# Patient Record
Sex: Male | Born: 1985 | Race: White | Hispanic: No | Marital: Single
Health system: Southern US, Community
[De-identification: ages and names within clinical notes are randomized; demographics above are authoritative.]

## PROBLEM LIST (undated history)

## (undated) DIAGNOSIS — F411 Generalized anxiety disorder: Secondary | ICD-10-CM

## (undated) DIAGNOSIS — F330 Major depressive disorder, recurrent, mild: Secondary | ICD-10-CM

## (undated) HISTORY — PX: VASECTOMY: SHX75

## (undated) HISTORY — DX: Generalized anxiety disorder: F41.1

## (undated) HISTORY — DX: Major depressive disorder, recurrent, mild: F33.0

---

## 2007-06-16 ENCOUNTER — Ambulatory Visit: Payer: Self-pay | Admitting: Specialist

## 2008-07-13 IMAGING — CT CT OF THE RIGHT SHOULDER WITHOUT CONTRAST
1 series · 12 of 14 positions shown, 15 images · non-contrast
Comparison: none

REASON FOR EXAM: right clavicle eval the non union fracture right clavicle
COMMENTS:

[Series 2: shoulder 3mm · axial · 0.46mm/px · z∈[-508,-406]mm · 12 of 42 slices shown, 15 images]
[im 4/42  soft-tissue]
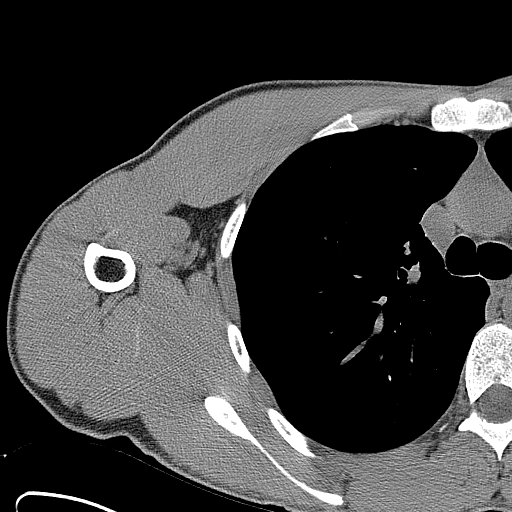
[im 4/42  bone]
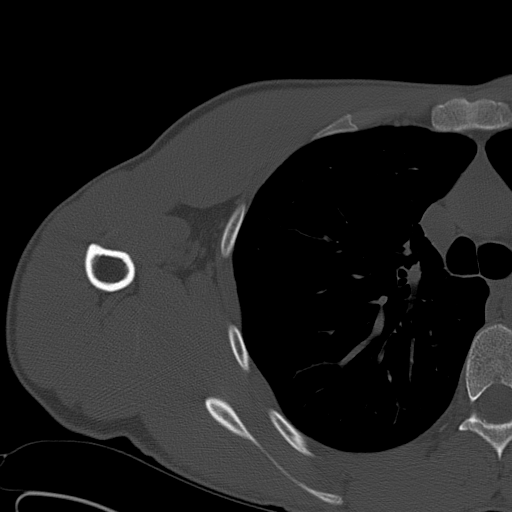
[im 7/42  bone]
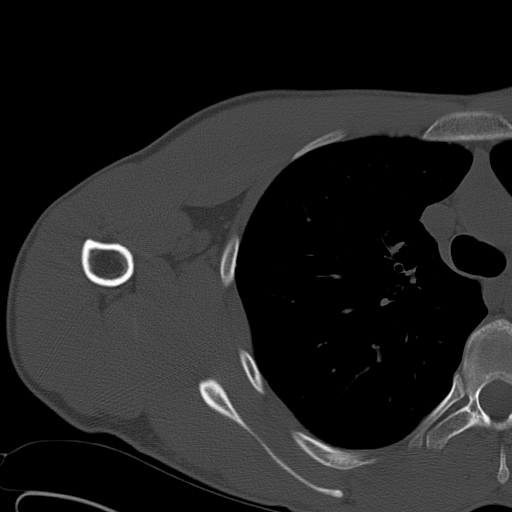
[im 10/42  bone]
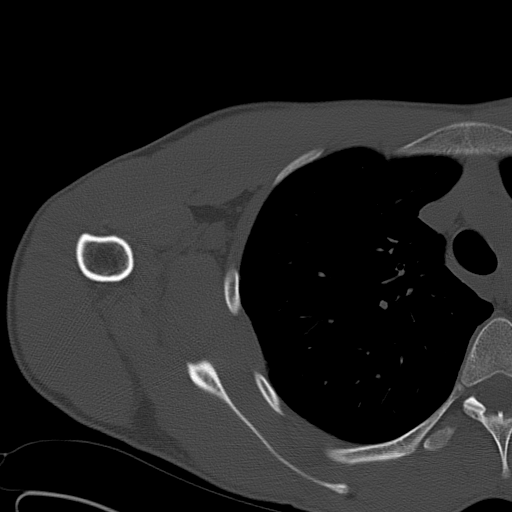
[im 13/42  bone]
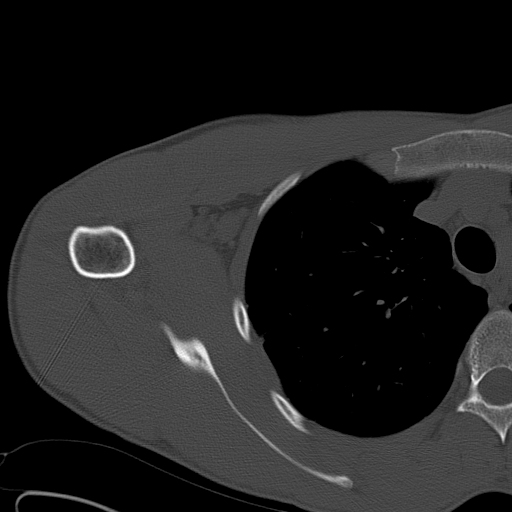
[im 16/42  soft-tissue]
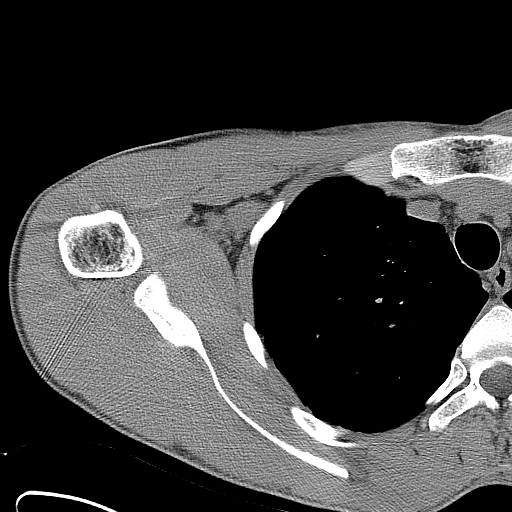
[im 16/42  bone]
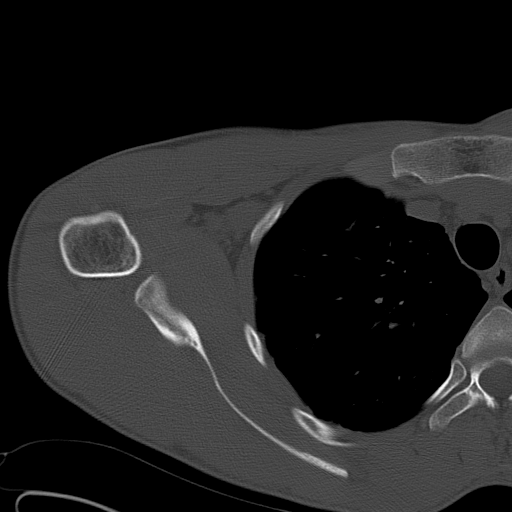
[im 19/42  bone]
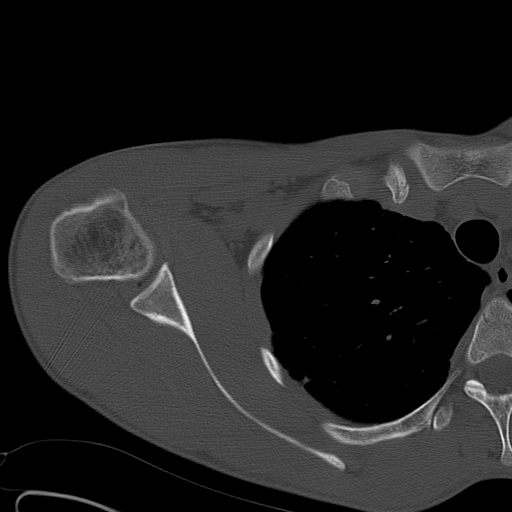
[im 23/42  bone]
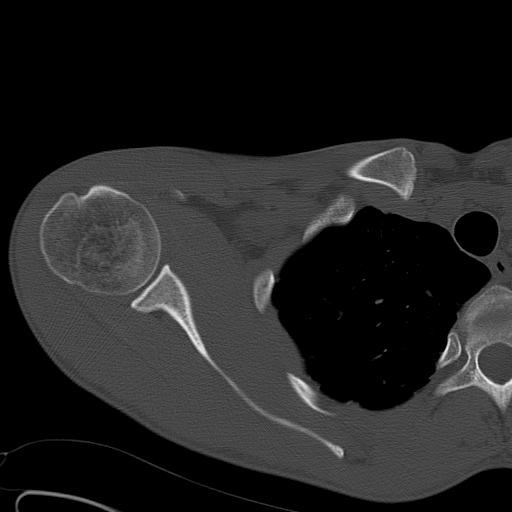
[im 26/42  bone]
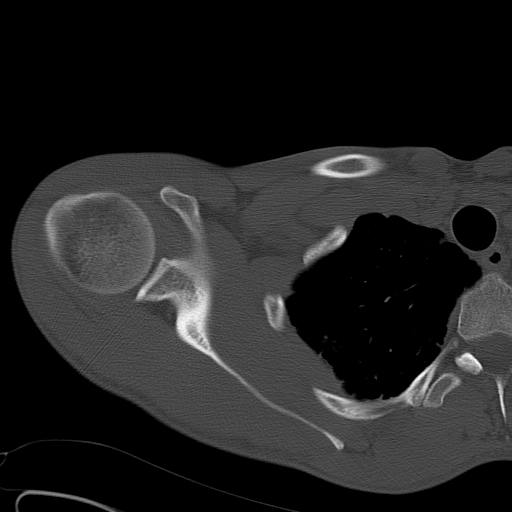
[im 29/42  soft-tissue]
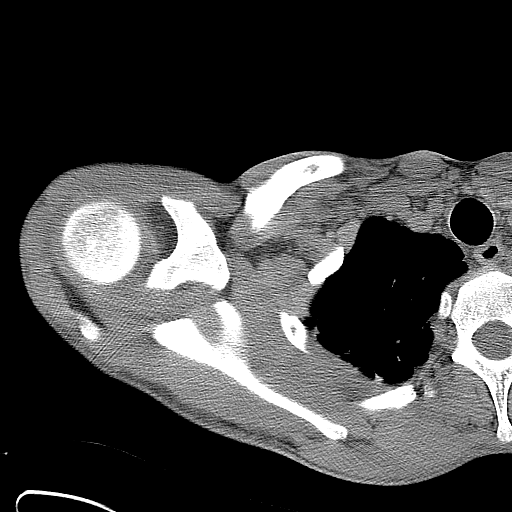
[im 29/42  bone]
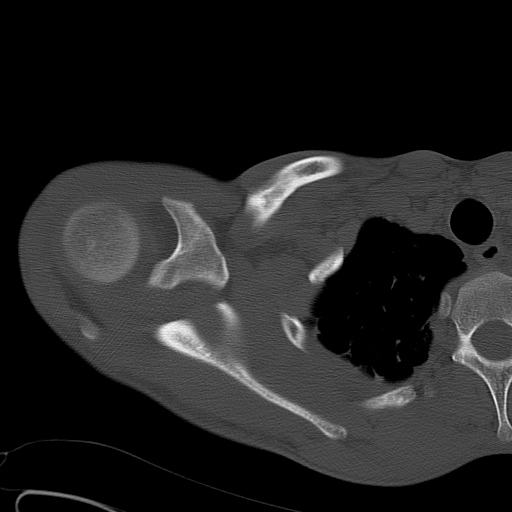
[im 32/42  bone]
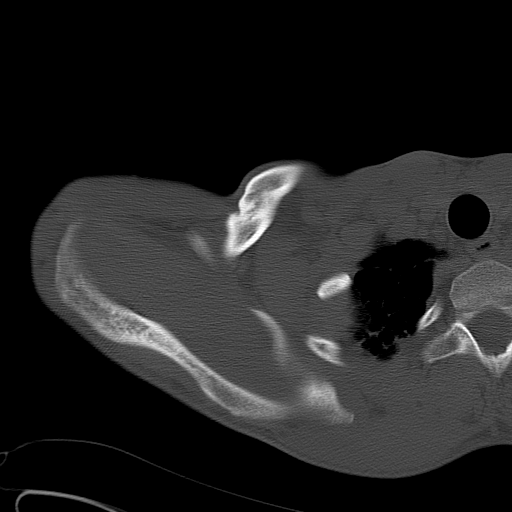
[im 35/42  bone]
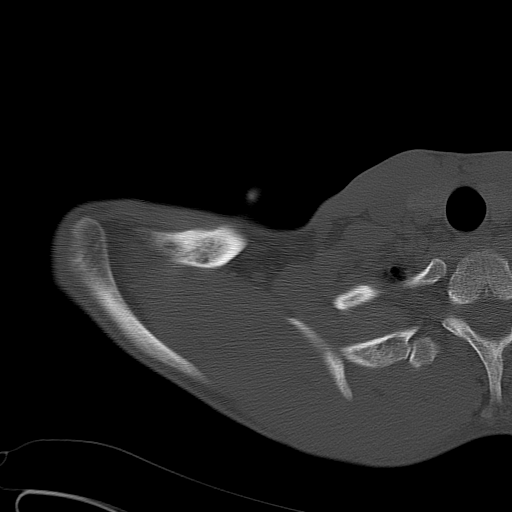
[im 38/42  bone]
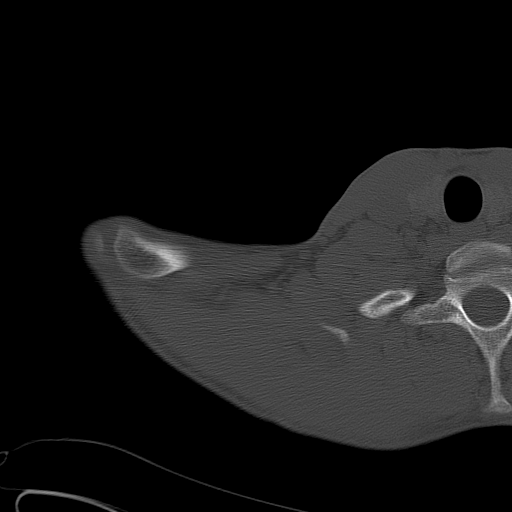

[12 of 14 positions shown; findings below may reference images not displayed]

PROCEDURE:     CT  - CT SHOULDER RIGHT WO  - June 16, 2007  [DATE]

RESULT:     Patient undergoing evaluation for apparent nonunion of a right
clavicular fracture. The patient has sustained a prior fracture of the
midshaft of the clavicle. Some lucency can be seen obliquely traversing the
midshaft of the clavicle which may reflect the residual fracture line. I see
no periosteal reaction. Is there obvious movement clinically at the fracture
site? The AC joint region is normal in appearance and the sternoclavicular
joint also appears normal. The glenohumeral joint is normal in appearance.
IMPRESSION: There is deformity of the midshaft of left clavicle and
there may be a residual fracture line, but the there is no periosteal
reaction and no surrounding soft tissue edema of significance. If there are
are clinical findings which suggest nonunion further imaging with MRI may be
of value in that it would allow evaluation of the bone marrow within the
clavicle for edema. Alternatively nuclear bone scanning could be considered
but it would like to be somewhat nonspecific.

## 2020-03-04 ENCOUNTER — Ambulatory Visit: Payer: 59 | Attending: Internal Medicine

## 2020-03-04 DIAGNOSIS — Z23 Encounter for immunization: Secondary | ICD-10-CM

## 2020-03-04 NOTE — Progress Notes (Signed)
   Covid-19 Vaccination Clinic  Name:  Jose Ruiz    MRN: [id number] DOB: Jan 27, 1986  03/04/2020  [name] was observed post Covid-19 immunization for 15 minutes without incident. He was provided with Vaccine Information Sheet and instruction to access the V-Safe system.   [name] was instructed to call 911 with any severe reactions post vaccine: Marland Kitchen Difficulty breathing  . Swelling of face and throat  . A fast heartbeat  . A bad rash all over body  . Dizziness and weakness   Immunizations Administered    Name Date Dose VIS Date Route   Pfizer COVID-19 Vaccine 03/04/2020  8:53 AM 0.3 mL 12/09/2019 Intramuscular   Manufacturer: [employer], [employer]   Lot: [id number]   NDC: [address]-4

## 2020-03-27 ENCOUNTER — Ambulatory Visit: Payer: 59 | Attending: Internal Medicine

## 2020-03-27 DIAGNOSIS — Z23 Encounter for immunization: Secondary | ICD-10-CM

## 2020-03-27 NOTE — Progress Notes (Signed)
   Covid-19 Vaccination Clinic  Name:  Jose Ruiz    MRN: [id number] DOB: Dec 31, 1985  03/27/2020  Jose Ruiz was observed post Covid-19 immunization for 15 minutes without incident. He was provided with Vaccine Information Sheet and instruction to access the V-Safe system.   Jose Ruiz was instructed to call 911 with any severe reactions post vaccine: Marland Kitchen Difficulty breathing  . Swelling of face and throat  . A fast heartbeat  . A bad rash all over body  . Dizziness and weakness   Immunizations Administered    Name Date Dose VIS Date Route   Pfizer COVID-19 Vaccine 03/27/2020  8:38 AM 0.3 mL 12/09/2019 Intramuscular   Manufacturer: [employer], [employer]   Lot: [id number]   NDC: [address]-7

## 2021-01-17 NOTE — Progress Notes (Signed)
 Assessment   Jose Ruiz is a 35 y.o. male presenting to [facility] for COVID testing.   Problem List Items Addressed This Visit    None    Visit Diagnoses    Contact with and (suspected) exposure to covid-19    -  Primary   Relevant Orders   RAPID INFLUENZA/RSV/COVID PCR   Contact with and (suspected) exposure to other viral communicable diseases       Relevant Orders   RAPID INFLUENZA/RSV/COVID PCR      Plan   If no testing performed, pt counseled on routine care for respiratory illness.  If testing performed, COVID sent.  Patient directed to Home given findings during today's visit.  Subjective   Jose Ruiz is a 35 y.o. male who [facility] with complaints of the following:  Exposure History: In the last 21 days?   Have you traveled outside of [place] ? No             Have you been in close contact with someone confirmed by a test to have COVID? (Close contact is within 6 feet for at least 10 minutes) Yes, Who? 0    Have you worked in a health care facility? No   Lived or worked facility like a nursing home, group home, or assisted living?   No     Are you scheduled to have surgery or a procedure in the next 3 days? No             Are you scheduled to receive cancer chemotherapy within the next 7 days?    No   Have you ever been tested before for COVID-19 with a swab of your nose? Yes: When: 2021, Where: 0  Are you a healthcare worker being tested so to return to work No     Right now,  do you have any of the following that developed over the past 7 days (as stated by patient on intake form):  Subjective fever (felt feverish) No  Chills (especially repeated shaking chills) No  Severe fatigue (felt very tired) Yes, how many days? 1  Muscle aches Yes, how many days? 1  Runny nose Yes, how many days? 1  Sore throat Yes, how many days? 1  Loss of taste or smell No  Cough (new onset or worsening  of chronic cough) Yes, how many days? 1  Shortness of breath No  Nausea or vomiting No  Headache Yes, how many days? 1  Abdominal Pain No  Diarrhea (3 or more loose stools in last 24 hours) No   History/Medical Conditions (as stated by patient on intake form):  Do you have any of the following:  Asthma or emphysema or COPD No  Cystic Fibrosis No  Diabetes No  High Blood Pressure  No  Cardiovascular Disease No  Chronic Kidney Disease No  Chronic Liver Disease No  Chronic blood disorder like Sickle Cell Disease  No  Weak immune system due to disease or medication No  Neurologic condition that limits movement  No  Developmental delay - Moderate to Severe  No  Recent (within past 2 weeks) or current Pregnancy No  Morbid Obesity (>100 pounds over ideal weight) No  Current Smoker No  Former Smoker No    Objective   Given above, testing performed: Yes  Testing Performed: Test Specimen Type Sent to  COVID-19  NP Sw[facility] [employer]    Scribe's Attestation: [place], [place] obtained  and performed the history, physical exam and medical decision making elements that were entered into the chart.  Signed by Holli Edin serving as Scribe, on 01/17/2021 12:52 PM  The documentation recorded by the scribe accurately reflects the service I personally performed and the decisions made by me. Antonio F. Trudy RIGGERS January 17, 2021 12:57 PM

## 2024-09-29 ENCOUNTER — Ambulatory Visit (INDEPENDENT_AMBULATORY_CARE_PROVIDER_SITE_OTHER): Payer: Self-pay | Admitting: Urology

## 2024-09-29 VITALS — BP 128/75 | HR 67 | Ht 77.0 in | Wt 184.4 lb

## 2024-09-29 DIAGNOSIS — Z8042 Family history of malignant neoplasm of prostate: Secondary | ICD-10-CM

## 2024-09-29 DIAGNOSIS — Z3009 Encounter for other general counseling and advice on contraception: Secondary | ICD-10-CM

## 2024-09-29 MED ORDER — DIAZEPAM 10 MG PO TABS: 10.0000 mg | ORAL_TABLET | Freq: Once | ORAL | 0 refills | Status: DC | PRN

## 2024-09-29 NOTE — Progress Notes (Signed)
   09/29/24 2:30 PM   Lynwood Toribio Laurence 1986/03/11 [id number]  CC: Discuss vasectomy, family history of prostate cancer  HPI: 38 year old male interested in vasectomy for permanent sterilization.  He and his wife have 4 children ages 61 through 72, do not desire further biologic children.  He has a family history of prostate cancer in his dad that required treatment and he was diagnosed at age 9.   Physical Exam: BP 128/75 (BP Location: Left Arm, Patient Position: Sitting, Cuff Size: Normal)   Pulse 67   Ht 6' 5 (1.956 m)   Wt 184 lb 6.4 oz (83.6 kg)   SpO2 97%   BMI 21.87 kg/m    Constitutional:  Alert and oriented, No acute distress. Cardiovascular: No clubbing, cyanosis, or edema. Respiratory: Normal respiratory effort, no increased work of breathing. GI: Abdomen is soft, nontender, nondistended, no abdominal masses GU: Phallus with pink meatus, no lesions, vas deferens easily palpable bilaterally, no testicular masses  Assessment & Plan:   38 year old male interested in vasectomy for permanent sterilization, family history of prostate cancer in his father diagnosed at age 57.  We reviewed the AUA guidelines that recommend initiating PSA screening at age 62 for men with a family history of prostate cancer.  We discussed the risks and benefits of vasectomy at length.  Vasectomy is intended to be a permanent form of contraception, and does not produce immediate sterility.  Following vasectomy another form of contraception is required until vas occlusion is confirmed by a post-vasectomy semen analysis obtained 2-3 months after the procedure.  Even after vas occlusion is confirmed, vasectomy is not 100% reliable in preventing pregnancy, and the failure rate is approximately 12/1998.  Repeat vasectomy is required in less than 1% of patients.  He should refrain from ejaculation for 1 week after vasectomy.  Options for fertility after vasectomy include vasectomy reversal, and sperm  retrieval with in vitro fertilization or ICSI.  These options are not always successful and may be expensive.  Finally, there are other permanent and non-permanent alternatives to vasectomy available. There is no risk of erectile dysfunction, and the volume of semen will be similar to prior, as the majority of the ejaculate is from the prostate and seminal vesicles.   The procedure takes ~20 minutes.  We recommend patients take 5-10 mg of Valium 30 minutes prior, and he will need a driver post-procedure.  Local anesthetic is injected into the scrotal skin and a small segment of the vas deferens is removed, and the ends occluded. The complication rate is approximately 1-2%, and includes bleeding, infection, and development of chronic scrotal pain.  PLAN: Schedule vasectomy Valium sent to pharmacy Start PSA screening at age 7  Redell Burnet, MD 09/29/2024  [facility] [address], [address] [place], [place] [address] [phone]

## 2024-09-29 NOTE — Patient Instructions (Signed)

## 2024-11-17 ENCOUNTER — Ambulatory Visit (INDEPENDENT_AMBULATORY_CARE_PROVIDER_SITE_OTHER): Admitting: Urology

## 2024-11-17 VITALS — BP 115/68 | HR 63 | Wt 185.0 lb

## 2024-11-17 DIAGNOSIS — Z9852 Vasectomy status: Secondary | ICD-10-CM

## 2024-11-17 NOTE — Progress Notes (Signed)
 VASECTOMY PROCEDURE NOTE:  The patient was taken to the minor procedure room and placed in the supine position. His genitals were prepped and draped in the usual sterile fashion. The right vas deferens was brought up to the skin of the right upper scrotum. The skin overlying it was anesthetized with 1% lidocaine without epinephrine, anesthetic was also injected alongside the vas deferens in the direction of the inguinal canal. The no scalpel vasectomy instrument was used to make a small perforation in the scrotal skin. The vasectomy clamp was used to grasp the vas deferens. It was carefully dissected free from surrounding structures. A 1cm segment of the vas was removed, and the cut ends of the mucosa were cauterized. No significant bleeding was noted. The vas deferens was returned to the scrotum. The skin incision was closed with a simple interrupted stitch of 4-0 chromic.  Attention was then turned to the left side. The left vasectomy was performed in the same exact fashion. Sterile dressings were placed over each incision. The patient tolerated the procedure well.  IMPRESSION/DIAGNOSIS: The patient is a 38 year old gentleman who underwent a vasectomy today. Post-procedure instructions were reviewed. I stressed the importance of continuing to use birth control until he provides a semen specimen more than 2 months from now that demonstrates azoospermia.  We discussed return precautions including fever over 101, significant bleeding or hematoma, or uncontrolled pain. I also stressed the importance of avoiding strenuous activity for one week, no sexual activity or ejaculations for 5 days, intermittent icing over the next 48 hours, and scrotal support.   PLAN: The patient will be advised of his semen analysis results when available.  Redell Burnet, MD 11/17/2024

## 2024-11-17 NOTE — Patient Instructions (Signed)

## 2025-01-05 ENCOUNTER — Encounter: Payer: Self-pay | Admitting: Family Medicine

## 2025-01-05 ENCOUNTER — Ambulatory Visit (INDEPENDENT_AMBULATORY_CARE_PROVIDER_SITE_OTHER): Admitting: Family Medicine

## 2025-01-05 VITALS — BP 119/74 | HR 82 | Temp 98.0°F | Ht 77.0 in | Wt 199.2 lb

## 2025-01-05 DIAGNOSIS — Z0000 Encounter for general adult medical examination without abnormal findings: Secondary | ICD-10-CM | POA: Diagnosis not present

## 2025-01-05 DIAGNOSIS — Z7689 Persons encountering health services in other specified circumstances: Secondary | ICD-10-CM

## 2025-01-05 DIAGNOSIS — F411 Generalized anxiety disorder: Secondary | ICD-10-CM | POA: Diagnosis not present

## 2025-01-05 DIAGNOSIS — F330 Major depressive disorder, recurrent, mild: Secondary | ICD-10-CM | POA: Diagnosis not present

## 2025-01-05 DIAGNOSIS — Z23 Encounter for immunization: Secondary | ICD-10-CM

## 2025-01-05 MED ORDER — ESCITALOPRAM OXALATE 10 MG PO TABS: 10.0000 mg | ORAL_TABLET | Freq: Every day | ORAL | 2 refills | Status: AC

## 2025-01-05 NOTE — Progress Notes (Signed)
 "  New Patient Office Visit  Subjective   Patient ID: Jose Ruiz, male    DOB: [birth year]  Age: 39 y.o. MRN: [id number]  CC:  Chief Complaint  Patient presents with   Medication Management    No concerns at today's visit establishing care needs medication refill.   HPI Jose Ruiz is a 39 year old male who presents to establish with [facility] at [facility].    - Vision Screening: up to date - Dental Visits: up to date - Testicular Exam: Declined - STD Screening: Declined - PSA (50+): Not applicable - Colonoscopy (45+): Not applicable  Discussed with patient purpose of the colonoscopy is to detect colon cancer at curable precancerous or early stages  - AAA Screening: Not applicable  Men age 67-75 who have ever smoked - Lung Cancer screening with low-dose CT: Not applicable-  Adults age 58-80 who are current cigarette smokers or quit within the last 15 years. Must have 20 pack year history.   IMMUNIZATIONS:  - Tdap: Tetanus vaccination status reviewed: Td vaccination indicated and given today. - Influenza: Administered today - Pneumovax: Not applicable - Prevnar: Not applicable - Shingrix vaccine (50+): Not applicable  ANXIETY: Jose Ruiz presents for the medical management of anxiety.  Current medication regimen: Lexapro  5mg  daily      01/05/2025    1:59 PM  GAD 7 : Generalized Anxiety Score  Nervous, Anxious, on Edge 1  Control/stop worrying 1  Worry too much - different things 1  Trouble relaxing 0  Restless 0  Easily annoyed or irritable 1  Afraid - awful might happen 0  Total GAD 7 Score 4  Anxiety Difficulty Not difficult at all      01/05/2025    1:58 PM  PHQ9 SCORE ONLY  PHQ-9 Total Score 3    Outpatient Encounter Medications as of 01/05/2025  Medication Sig   escitalopram  (LEXAPRO ) 10 MG tablet Take 1 tablet (10 mg total) by mouth daily.   [DISCONTINUED] diazepam  (VALIUM ) 10 MG tablet Take 1 tablet (10 mg total) by mouth  once as needed for up to 1 dose (TAKE 1 HOUR PRIOR TO VASECTOMY). (Patient not taking: Reported on 01/05/2025)   No facility-administered encounter medications on file as of 01/05/2025.   There are no active problems to display for this patient.  Past Medical History:  Diagnosis Date   GAD (generalized anxiety disorder)    MDD (major depressive disorder), recurrent episode, mild    Past Surgical History:  Procedure Laterality Date   VASECTOMY     Family History  Problem Relation Age of Onset   Prostate cancer Father 21   Hypertension Father    Prostate cancer Paternal Uncle    Brain cancer Paternal Uncle        neuroblastoma   Social History   Socioeconomic History   Marital status: Single    Spouse name: Not on file   Number of children: Not on file   Years of education: Not on file   Highest education level: Not on file  Occupational History   Not on file  Tobacco Use   Smoking status: Never   Smokeless tobacco: Never  Vaping Use   Vaping status: Never Used  Substance and Sexual Activity   Alcohol use: Yes    Alcohol/week: 8.0 standard drinks of alcohol    Types: 8 Standard drinks or equivalent per week    Comment: social   Drug use: Never   Sexual activity:  Not on file  Other Topics Concern   Not on file  Social History Narrative   Not on file   Social Drivers of Health   Tobacco Use: Low Risk (01/05/2025)   Patient History    Smoking Tobacco Use: Never    Smokeless Tobacco Use: Never    Passive Exposure: Not on file  Financial Resource Strain: Not on file  Food Insecurity: Not on file  Transportation Needs: Not on file  Physical Activity: Not on file  Stress: Not on file  Social Connections: Not on file  Intimate Partner Violence: Not on file  Depression (PHQ2-9): Low Risk (01/05/2025)   Depression (PHQ2-9)    PHQ-2 Score: 3  Alcohol Screen: Not on file  Housing: Not on file  Utilities: Not on file  Health Literacy: Not on file   Outpatient  Medications Prior to Visit  Medication Sig Dispense Refill   diazepam  (VALIUM ) 10 MG tablet Take 1 tablet (10 mg total) by mouth once as needed for up to 1 dose (TAKE 1 HOUR PRIOR TO VASECTOMY). (Patient not taking: Reported on 01/05/2025) 1 tablet 0   No facility-administered medications prior to visit.   Allergies[1]  ROS: see HPI    Objective   Today's Vitals   01/05/25 1355  BP: 119/74  Pulse: 82  Temp: 98 F (36.7 C)  SpO2: 97%  Weight: 199 lb 3.2 oz (90.4 kg)  Height: 6' 5 (1.956 m)   GENERAL: Well-appearing, in NAD. Well nourished.  SKIN: Pink, warm and dry. No rash, lesion, ulceration, or ecchymoses.  Head: Normocephalic. NECK: Trachea midline. Full ROM w/o pain or tenderness. No lymphadenopathy.  EARS: Tympanic membranes are intact, translucent without bulging and without drainage. Appropriate landmarks visualized.  EYES: Conjunctiva clear without exudates. EOMI, PERRL, no drainage present.  NOSE: Septum midline w/o deformity. Nares patent, mucosa pink and non-inflamed w/o drainage. No sinus tenderness.  THROAT: Uvula midline. Oropharynx clear. Tonsils non-inflamed without exudate. Mucous membranes pink and moist.  RESPIRATORY: Chest wall symmetrical. Respirations even and non-labored. Breath sounds clear to auscultation bilaterally.  CARDIAC: S1, S2 present, regular rate and rhythm without murmur or gallops. Peripheral pulses 2+ bilaterally.  MSK: Muscle tone and strength appropriate for age. Joints w/o tenderness, redness, or swelling.  EXTREMITIES: Without clubbing, cyanosis, or edema.  NEUROLOGIC: No motor or sensory deficits. Steady, even gait. C2-C12 intact.  PSYCH/MENTAL STATUS: Alert, oriented x 3. Cooperative, appropriate mood and affect.     Assessment & Plan:   1. Encounter to establish care (Primary) Patient is a 39- year-old male who presents today to establish care with primary care at [facility]. Reviewed the past medical history, family history, social  history, surgical history, medications and allergies today- updates made as indicated. Patient has concerns today about the following:   2. MDD (major depressive disorder), recurrent episode, mild Currently on Lexapro  5 mg. Depression score is 3, indicating mild depression. Considering increasing the dose to 10 mg for better symptom control. Increased Lexapro  to 10 mg daily. Rx sent to pharmacy. Schedule mood follow-up in six weeks.  3. GAD (generalized anxiety disorder) Currently on Lexapro  5 mg. Anxiety score is 4, indicating mild anxiety. Considering increasing the dose to 10 mg for better symptom control. Increased Lexapro  to 10 mg daily. Rx sent to pharmacy on file. Schedule mood follow-up in six weeks.     4. Wellness examination - Encouraged to adjust caloric intake to maintain or achieve ideal body weight, to reduce intake of dietary saturated fat  and total fat, to limit sodium intake by avoiding high sodium foods and not adding table salt, and to maintain adequate dietary potassium and calcium preferably from fresh fruits, vegetables, and low-fat dairy products.   - Advised to avoid cigarette smoking. - Discussed with the patient that most people either abstain from alcohol or drink within safe limits (<=14/week and <=4 drinks/occasion for males, <=7/weeks and <= 3 drinks/occasion for females) and that the risk for alcohol disorders and other health effects rises proportionally with the number of drinks per week and how often a drinker exceeds daily limits. - Discussed cessation/primary prevention of drug use and availability of treatment for abuse.  - Stressed the importance of regular exercise - Injury prevention: Discussed safety belts, safety helmets, smoke detector, smoking near bedding or upholstery.  - Dental health: Discussed importance of regular tooth brushing, flossing, and dental visits.  - Sexuality: Discussed sexually transmitted diseases, partner selection, use of condoms,  avoidance of unintended pregnancy  and contraceptive alternatives.   5. Healthcare maintenance Plan to obtain fasting labs at next visit.  - CBC with Differential/Platelet; Future - Comprehensive metabolic panel with GFR; Future - Hemoglobin A1c; Future - Lipid panel; Future - TSH Rfx on Abnormal to Free T4; Future   Return in about 6 weeks (around 02/16/2025) for Mood f/u (morning appt) .   Evalene Arts, FNP     [1] Not on File  "

## 2025-01-05 NOTE — Patient Instructions (Signed)
 Health Maintenance Recommendations Screening Testing Colon Cancer Screening Every 1-10 years based on test performed, risk factors, and history Starting at age 39 Bone Density Screening Every 2-10 years based on history Starting at age 89 for women Recommendations for men differ based on medication usage, history, and risk factors AAA Screening One time ultrasound Men 64-41 years old who have every smoked Lung Cancer Screening Low Dose Lung CT every 12 months Age 37-80 years with a 30 pack-year smoking history who still smoke or who have quit within the last 15 years   Screening Labs Routine  Labs: Complete Blood Count (CBC), Complete Metabolic Panel (CMP), Cholesterol (Lipid Panel) Every 6-12 months based on history and medications May be recommended more frequently based on current conditions or previous results Hemoglobin A1c Lab Every 3-12 months based on history and previous results Starting at age 63 or earlier with diagnosis of diabetes, high cholesterol, BMI >26, and/or risk factors Frequent monitoring for patients with diabetes to ensure blood sugar control Thyroid Panel (TSH w/ T3 & T4) Every 6 months based on history, symptoms, and risk factors May be repeated more often if on medication HIV One time testing for all patients 48 and older May be repeated more frequently for patients with increased risk factors or exposure Hepatitis C One time testing for all patients 57 and older May be repeated more frequently for patients with increased risk factors or exposure Gonorrhea, Chlamydia Every 12 months for all sexually active persons 13-24 years Additional monitoring may be recommended for those who are considered high risk or who have symptoms PSA Men 48-51 years old with risk factors Additional screening may be recommended from age 26-69 based on risk factors, symptoms, and history   Vaccine Recommendations Tetanus Booster All adults every 10 years Flu Vaccine All  patients 6 months and older every year COVID Vaccine All patients 12 years and older Initial dosing with booster May recommend additional booster based on age and health history HPV Vaccine 2 doses all patients age 32-26 Dosing may be considered for patients over 26 Shingles Vaccine (Shingrix) 2 doses all adults 55 years and older Pneumonia (Pneumovax 23) All adults 65 years and older May recommend earlier dosing based on health history Pneumonia (Prevnar 9) All adults 65 years and older Dosed 1 year after Pneumovax 23   Additional Screening, Testing, and Vaccinations may be recommended on an individualized basis based on family history, health history, risk factors, and/or exposure.  __________________________________________________________   Diet Recommendations for All Patients   I recommend that all patients maintain a diet low in saturated fats, carbohydrates, and cholesterol. While this can be challenging at first, it is not impossible and small changes can make big differences.  Things to try: Decreasing the amount of soda, sweet tea, and/or juice to one or less per day and replace with water While water is always the first choice, if you do not like water you may consider adding a water additive without sugar to improve the taste other sugar free drinks Replace potatoes with a brightly colored vegetable at dinner Use healthy oils, such as canola oil or olive oil, instead of butter or hard margarine Limit your bread intake to two pieces or less a day Replace regular pasta with low carb pasta options Bake, broil, or grill foods instead of frying Monitor portion sizes  Eat smaller, more frequent meals throughout the day instead of large meals   An important thing to remember is, if you love foods that  are not great for your health, you don't have to give them up completely. Instead, allow these foods to be a reward when you have done well. Allowing yourself to still have  special treats every once in a while is a nice way to tell yourself thank you for working hard to keep yourself healthy.    Also remember that every day is a new day. If you have a bad day and fall off the wagon, you can still climb right back up and keep moving along on your journey!   We have resources available to help you!  Some websites that may be helpful include: www.http://www.wall-moore.info/        Www.[employer].com _____________________________________________________________   Activity Recommendations for All Patients   I recommend that all adults get at least 20 minutes of moderate physical activity that elevates your heart rate at least 5 days out of the week.  Some examples include: Walking or jogging at a pace that allows you to carry on a conversation Cycling (stationary bike or outdoors) Water aerobics Yoga Weight lifting Dancing If physical limitations prevent you from putting stress on your joints, exercise in a pool or seated in a chair are excellent options.   Do determine your MAXIMUM heart rate for activity: YOUR AGE - 220 = MAX HeartRate    Remember! Do not push yourself too hard.  Start slowly and build up your pace, speed, weight, time in exercise, etc.  Allow your body to rest between exercise and get good sleep. You will need more water than normal when you are exerting yourself. Do not wait until you are thirsty to drink. Drink with a purpose of getting in at least 8, 8 ounce glasses of water a day plus more depending on how much you exercise and sweat.      If you begin to develop dizziness, chest pain, abdominal pain, jaw pain, shortness of breath, headache, vision changes, lightheadedness, or other concerning symptoms, stop the activity and allow your body to rest. If your symptoms are severe, seek emergency evaluation immediately. If your symptoms are concerning, but not severe, please let us  know so that we can recommend further evaluation.

## 2025-02-16 ENCOUNTER — Ambulatory Visit: Admitting: Family Medicine

## 2025-02-20 ENCOUNTER — Other Ambulatory Visit
